# Patient Record
Sex: Male | Born: 1975 | Race: White | Hispanic: No | Marital: Single | Smoking: Current every day smoker
Health system: Southern US, Community
[De-identification: ages and names within clinical notes are randomized; demographics above are authoritative.]

## PROBLEM LIST (undated history)

## (undated) DIAGNOSIS — I639 Cerebral infarction, unspecified: Secondary | ICD-10-CM

---

## 1999-02-17 ENCOUNTER — Emergency Department (HOSPITAL_COMMUNITY): Admission: EM | Admit: 1999-02-17 | Discharge: 1999-02-17 | Payer: Self-pay | Admitting: Emergency Medicine

## 1999-02-17 ENCOUNTER — Encounter: Payer: Self-pay | Admitting: Emergency Medicine

## 2002-06-05 DIAGNOSIS — I639 Cerebral infarction, unspecified: Secondary | ICD-10-CM

## 2002-06-05 HISTORY — DX: Cerebral infarction, unspecified: I63.9

## 2012-08-26 ENCOUNTER — Emergency Department (HOSPITAL_COMMUNITY): Payer: BC Managed Care – PPO

## 2012-08-26 ENCOUNTER — Emergency Department (HOSPITAL_COMMUNITY)
Admission: EM | Admit: 2012-08-26 | Discharge: 2012-08-26 | Disposition: A | Discharge status: Home / Self Care | Payer: BC Managed Care – PPO | Attending: Emergency Medicine | Admitting: Emergency Medicine

## 2012-08-26 ENCOUNTER — Encounter (HOSPITAL_COMMUNITY): Payer: Self-pay | Admitting: Emergency Medicine

## 2012-08-26 DIAGNOSIS — F17200 Nicotine dependence, unspecified, uncomplicated: Secondary | ICD-10-CM | POA: Insufficient documentation

## 2012-08-26 DIAGNOSIS — R071 Chest pain on breathing: Secondary | ICD-10-CM | POA: Insufficient documentation

## 2012-08-26 DIAGNOSIS — R209 Unspecified disturbances of skin sensation: Secondary | ICD-10-CM | POA: Insufficient documentation

## 2012-08-26 DIAGNOSIS — Z79899 Other long term (current) drug therapy: Secondary | ICD-10-CM | POA: Insufficient documentation

## 2012-08-26 DIAGNOSIS — R0789 Other chest pain: Secondary | ICD-10-CM

## 2012-08-26 DIAGNOSIS — Z7982 Long term (current) use of aspirin: Secondary | ICD-10-CM | POA: Insufficient documentation

## 2012-08-26 HISTORY — DX: Cerebral infarction, unspecified: I63.9

## 2012-08-26 LAB — BASIC METABOLIC PANEL
BUN: 12 mg/dL (ref 6–23)
CO2: 26 mEq/L (ref 19–32)
Calcium: 9.8 mg/dL (ref 8.4–10.5)
Chloride: 102 mEq/L (ref 96–112)
Creatinine, Ser: 0.92 mg/dL (ref 0.50–1.35)
GFR calc Af Amer: >90 mL/min (ref >90)
GFR calc non Af Amer: >90 mL/min (ref >90)
Glucose, Bld: 92 mg/dL (ref 70–99)
Potassium: 4.6 mEq/L (ref 3.5–5.1)
Sodium: 138 mEq/L (ref 135–145)

## 2012-08-26 LAB — CBC WITH DIFFERENTIAL/PLATELET
Basophils Absolute: 0 10*3/uL (ref 0.0–0.1)
Basophils Relative: 1 % (ref 0–1)
Eosinophils Absolute: 0.2 10*3/uL (ref 0.0–0.7)
Eosinophils Relative: 3 % (ref 0–5)
HCT: 41.5 % (ref 39.0–52.0)
Hemoglobin: 15.2 g/dL (ref 13.0–17.0)
Lymphocytes Relative: 22 % (ref 12–46)
Lymphs Abs: 1.4 10*3/uL (ref 0.7–4.0)
MCH: 29.6 pg (ref 26.0–34.0)
MCHC: 36.6 g/dL — ABNORMAL HIGH (ref 30.0–36.0)
MCV: 80.7 fL (ref 78.0–100.0)
Monocytes Absolute: 0.5 10*3/uL (ref 0.1–1.0)
Monocytes Relative: 7 % (ref 3–12)
Neutro Abs: 4.3 10*3/uL (ref 1.7–7.7)
Neutrophils Relative %: 68 % (ref 43–77)
Platelets: 241 10*3/uL (ref 150–400)
RBC: 5.14 MIL/uL (ref 4.22–5.81)
RDW: 12.7 % (ref 11.5–15.5)
WBC: 6.4 10*3/uL (ref 4.0–10.5)

## 2012-08-26 LAB — POCT I-STAT TROPONIN I: Troponin i, poc: 0 ng/mL (ref 0.00–0.08)

## 2012-08-26 NOTE — ED Notes (Signed)
Onset today while at work sitting down developed sudden onset of tunnel vision, left sided chest pain 4/10 achy radiating to right upper extremity numbness.  Bilateral equal strong hand grasps. Ax4 answering and following commands appropriate.  History of a stroke 10 years ago no residual effects.

## 2012-08-26 NOTE — ED Provider Notes (Signed)
  Medical screening examination/treatment/procedure(s) were performed by non-physician practitioner and as supervising physician I was immediately available for consultation/collaboration.    Gerhard Munch, MD 08/26/12 [phone]

## 2012-08-26 NOTE — ED Provider Notes (Signed)
History     CSN: [id number]  Arrival date & time 08/26/12  1059   First MD Initiated Contact with Patient 08/26/12 1145      Chief Complaint  Patient presents with  . Chest Pain  . Numbness    (Consider location/radiation/quality/duration/timing/severity/associated sxs/prior treatment) Patient is a 37 y.o. male presenting with chest pain. The history is provided by the patient. No language interpreter was used.  Chest Pain Pain location:  L chest Pain quality: aching   Pain radiates to the back: no   Pain severity:  Moderate Timing:  Constant Progression:  Worsening Chronicity:  New Relieved by:  Nothing Worsened by:  Nothing tried Ineffective treatments:  None tried  Pt complains of left sided chest pain that radiated to arms.  Pt reports arms felt tingly.  Pt reports he had a stroke 10 years ago and worrys about other symptoms.   Past Medical History  Diagnosis Date  . Stroke 2004    left side deficits, completely resolved    History reviewed. No pertinent past surgical history.  History reviewed. No pertinent family history.  History  Substance Use Topics  . Smoking status: Current Every Day Smoker  . Smokeless tobacco: Not on file  . Alcohol Use: Yes      Review of Systems  Cardiovascular: Positive for chest pain.  All other systems reviewed and are negative.    Allergies  Review of patient's allergies indicates no known allergies.  Home Medications   Current Outpatient Rx  Name  Route  Sig  Dispense  Refill  . aspirin 325 MG tablet   Oral   Take 650 mg by mouth once as needed for pain.         Marland Kitchen aspirin EC 81 MG tablet   Oral   Take 81 mg by mouth daily.         . Multiple Vitamin (MULTIVITAMIN WITH MINERALS) TABS   Oral   Take 1 tablet by mouth daily.           BP 144/97  Pulse 67  Temp(Src) 97.8 F (36.6 C) (Oral)  Resp 18  Ht 6' (1.829 m)  Wt 160 lb (72.576 kg)  BMI 21.7 kg/m2  SpO2 96%  Physical Exam  Nursing note  and vitals reviewed. Constitutional: He is oriented to person, place, and time. He appears well-developed and well-nourished.  HENT:  Head: Normocephalic.  Right Ear: External ear normal.  Left Ear: External ear normal.  Nose: Nose normal.  Mouth/Throat: Oropharynx is clear and moist.  Eyes: Conjunctivae are normal. Pupils are equal, round, and reactive to light.  Neck: Normal range of motion.  Cardiovascular: Normal rate and normal heart sounds.   Pulmonary/Chest: Effort normal.  Abdominal: Soft.  Musculoskeletal: Normal range of motion.  Neurological: He is alert and oriented to person, place, and time. He has normal reflexes.  Skin: Skin is warm.  Psychiatric: He has a normal mood and affect.    ED Course  Procedures (including critical care time)  Labs Reviewed  CBC WITH DIFFERENTIAL - Abnormal; Notable for the following:    MCHC 36.6 (*)    All other components within normal limits  BASIC METABOLIC PANEL  POCT I-STAT TROPONIN I   Dg Chest 2 View  08/26/2012  *RADIOLOGY REPORT*  Clinical Data: Chest pain  CHEST - 2 VIEW  Comparison: 06/04/2012  Findings: The heart and pulmonary vascularity are within normal limits.  The lungs are mildly hyperinflated likely related to a  vigorous inspiratory effort.  No focal infiltrate or sizable effusion is seen.  No bony abnormality is noted.  IMPRESSION: No acute abnormality noted.   Original Report Authenticated By: Alcide Clever, M.D.      No diagnosis found.    MDM   Date: 08/26/2012  Rate: 72  Rhythm: normal sinus rhythm  QRS Axis: normal  Intervals: normal  ST/T Wave abnormalities: normal  Conduction Disutrbances:none  Narrative Interpretation:   Old EKG Reviewed: none available     Results for orders placed during the hospital encounter of 08/26/12  CBC WITH DIFFERENTIAL      Result Value Range   WBC 6.4  4.0 - 10.5 K/uL   RBC 5.14  4.22 - 5.81 MIL/uL   Hemoglobin 15.2  13.0 - 17.0 g/dL   HCT 16.1  09.6 - 04.5 %    MCV 80.7  78.0 - 100.0 fL   MCH 29.6  26.0 - 34.0 pg   MCHC 36.6 (*) 30.0 - 36.0 g/dL   RDW 40.9  81.1 - 91.4 %   Platelets 241  150 - 400 K/uL   Neutrophils Relative 68  43 - 77 %   Neutro Abs 4.3  1.7 - 7.7 K/uL   Lymphocytes Relative 22  12 - 46 %   Lymphs Abs 1.4  0.7 - 4.0 K/uL   Monocytes Relative 7  3 - 12 %   Monocytes Absolute 0.5  0.1 - 1.0 K/uL   Eosinophils Relative 3  0 - 5 %   Eosinophils Absolute 0.2  0.0 - 0.7 K/uL   Basophils Relative 1  0 - 1 %   Basophils Absolute 0.0  0.0 - 0.1 K/uL  BASIC METABOLIC PANEL      Result Value Range   Sodium 138  135 - 145 mEq/L   Potassium 4.6  3.5 - 5.1 mEq/L   Chloride 102  96 - 112 mEq/L   CO2 26  19 - 32 mEq/L   Glucose, Bld 92  70 - 99 mg/dL   BUN 12  6 - 23 mg/dL   Creatinine, Ser 7.82  0.50 - 1.35 mg/dL   Calcium 9.8  8.4 - 95.6 mg/dL   GFR calc non Af Amer >90  >90 mL/min   GFR calc Af Amer >90  >90 mL/min  POCT I-STAT TROPONIN I      Result Value Range   Troponin i, poc 0.00  0.00 - 0.08 ng/mL   Comment 3            Dg Chest 2 View  08/26/2012  *RADIOLOGY REPORT*  Clinical Data: Chest pain  CHEST - 2 VIEW  Comparison: 06/04/2012  Findings: The heart and pulmonary vascularity are within normal limits.  The lungs are mildly hyperinflated likely related to a vigorous inspiratory effort.  No focal infiltrate or sizable effusion is seen.  No bony abnormality is noted.  IMPRESSION: No acute abnormality noted.   Original Report Authenticated By: Alcide Clever, M.D.     Troponin negative,  Bmet.  Cbc  Normal.    Pt advised to follow up with his Md for recheck.   Tylenol for discomfort   Elson Areas, [place] 08/26/12 1341

## 2013-04-28 ENCOUNTER — Emergency Department (HOSPITAL_COMMUNITY): Payer: BC Managed Care – PPO

## 2013-04-28 ENCOUNTER — Emergency Department (HOSPITAL_COMMUNITY)
Admission: EM | Admit: 2013-04-28 | Discharge: 2013-04-28 | Disposition: A | Discharge status: Home / Self Care | Payer: BC Managed Care – PPO | Attending: Emergency Medicine | Admitting: Emergency Medicine

## 2013-04-28 DIAGNOSIS — J029 Acute pharyngitis, unspecified: Secondary | ICD-10-CM | POA: Insufficient documentation

## 2013-04-28 DIAGNOSIS — R682 Dry mouth, unspecified: Secondary | ICD-10-CM

## 2013-04-28 DIAGNOSIS — K117 Disturbances of salivary secretion: Secondary | ICD-10-CM | POA: Insufficient documentation

## 2013-04-28 DIAGNOSIS — F17200 Nicotine dependence, unspecified, uncomplicated: Secondary | ICD-10-CM | POA: Insufficient documentation

## 2013-04-28 DIAGNOSIS — Z792 Long term (current) use of antibiotics: Secondary | ICD-10-CM | POA: Insufficient documentation

## 2013-04-28 DIAGNOSIS — M542 Cervicalgia: Secondary | ICD-10-CM | POA: Insufficient documentation

## 2013-04-28 DIAGNOSIS — Z7982 Long term (current) use of aspirin: Secondary | ICD-10-CM | POA: Insufficient documentation

## 2013-04-28 DIAGNOSIS — Z9889 Other specified postprocedural states: Secondary | ICD-10-CM | POA: Insufficient documentation

## 2013-04-28 DIAGNOSIS — Z8673 Personal history of transient ischemic attack (TIA), and cerebral infarction without residual deficits: Secondary | ICD-10-CM | POA: Insufficient documentation

## 2013-04-28 LAB — COMPREHENSIVE METABOLIC PANEL
Alkaline Phosphatase: 70 U/L (ref 39–117)
BUN: 13 mg/dL (ref 6–23)
CO2: 28 mEq/L (ref 19–32)
Chloride: 102 mEq/L (ref 96–112)
GFR calc Af Amer: >90 mL/min (ref >90)
Glucose, Bld: 85 mg/dL (ref 70–99)
Potassium: 4.7 mEq/L (ref 3.5–5.1)
Total Bilirubin: 0.7 mg/dL (ref 0.3–1.2)

## 2013-04-28 LAB — CBC WITH DIFFERENTIAL/PLATELET
Hemoglobin: 14 g/dL (ref 13.0–17.0)
Lymphs Abs: 1.3 10*3/uL (ref 0.7–4.0)
MCH: 29.7 pg (ref 26.0–34.0)
Monocytes Relative: 10 % (ref 3–12)
Neutro Abs: 2.8 10*3/uL (ref 1.7–7.7)
Neutrophils Relative %: 57 % (ref 43–77)
RBC: 4.72 MIL/uL (ref 4.22–5.81)

## 2013-04-28 MED ORDER — IOHEXOL 300 MG/ML  SOLN
75.0000 mL | Freq: Once | INTRAMUSCULAR | Status: AC | PRN
  Administered 2013-04-28: 75 mL via INTRAVENOUS

## 2013-04-28 NOTE — ED Notes (Signed)
MD at bedside. 

## 2013-04-28 NOTE — ED Provider Notes (Signed)
Patient initially seen and evaluated by Ms. Trellis Moment. He presents with foreign body sensation in his throat for the last month. There is some associated dry mouth. Symptoms are mainly on the left. CT showed no evidence of foreign body. he has no other symptoms suggestive of lupus. He is referred back to his PCP for further outpatient evaluation. I've discussed with him about the possibility of Sjogren's syndrome.  Medical screening examination/treatment/procedure(s) were conducted as a shared visit with non-physician practitioner(s) and myself.  I personally evaluated the patient during the encounter.   Dione Booze, MD 04/28/13 [phone]

## 2013-04-28 NOTE — ED Notes (Signed)
Pt states had  Dental surgery  X 1 month ago  Felt like his throat has been swelling x 1 week went to dr and placed on antiobiotics , but still feels like it has been swelling and veins under tongue has alsonfelt like they have been swelling pt is handling all oral secreations well  Has been able to eat and drink but todayfelt different

## 2013-04-28 NOTE — ED Provider Notes (Signed)
CSN: [id number]     Arrival date & time 04/28/13  1055 History   First MD Initiated Contact with Patient 04/28/13 1405     Chief Complaint  Patient presents with  . Oral Swelling   (Consider location/radiation/quality/duration/timing/severity/associated sxs/prior Treatment) HPI David Arellano is a 37 y.o. male who presents to emergency department complaining of left-sided neck pain. Patient states that he had a dental procedure done less than a month ago where they extracted tooth and inserted a prostatic bone graft. Patient states that he was seen approximately 2 weeks ago for pain in that side and was given antibiotics for possible infection. Patient is paced he is been taking amoxicillin "on and off" for 2 weeks. States his pain is not improving. Patient states that his pain is worse with swallowing and he feels like his throat is closing up. States that it is tender to palpation as well. States this morning he felt like he couldn't swallow. Patient denies any fever, chills. He denies any neck swelling. He denies any injuries.  Past Medical History  Diagnosis Date  . Stroke 2004    left side deficits, completely resolved   No past surgical history on file. No family history on file. History  Substance Use Topics  . Smoking status: Current Every Day Smoker  . Smokeless tobacco: Not on file  . Alcohol Use: Yes    Review of Systems  Constitutional: Negative for fever and chills.  HENT: Positive for sore throat. Negative for dental problem.   Respiratory: Negative for cough, chest tightness and shortness of breath.   Cardiovascular: Negative for chest pain, palpitations and leg swelling.  Gastrointestinal: Negative for nausea, vomiting, abdominal pain, diarrhea and abdominal distention.  Genitourinary: Negative for dysuria.  Musculoskeletal: Positive for neck pain. Negative for arthralgias, myalgias and neck stiffness.  Skin: Negative for rash.  Allergic/Immunologic:  Negative for immunocompromised state.  Neurological: Negative for dizziness, weakness, light-headedness, numbness and headaches.    Allergies  Review of patient's allergies indicates no known allergies.  Home Medications   Current Outpatient Rx  Name  Route  Sig  Dispense  Refill  . amoxicillin (AMOXIL) 500 MG capsule   Oral   Take 500 mg by mouth 3 (three) times daily. Take for 10 days. First dose 04/09/13         . aspirin 325 MG tablet   Oral   Take 325 mg by mouth daily.          . Multiple Vitamin (MULTIVITAMIN WITH MINERALS) TABS   Oral   Take 1 tablet by mouth daily.          BP 145/92  Pulse 58  Temp(Src) 98.3 F (36.8 C)  Resp 16  Wt 140 lb (63.504 kg)  SpO2 98% Physical Exam  Nursing note and vitals reviewed. Constitutional: He appears well-developed and well-nourished. No distress.  HENT:  Head: Normocephalic and atraumatic.  Right Ear: External ear normal.  Left Ear: External ear normal.  Nose: Nose normal.  Mouth/Throat: Oropharynx is clear and moist.  No pharynx edema. Uvula midline.  Eyes: Conjunctivae are normal.  Neck: Normal range of motion. Neck supple.  Tender to palpation over left submandibular area and left anterior cervical area. No lymphadenopathy noted. No masses palpated.  Cardiovascular: Normal rate, regular rhythm and normal heart sounds.   Pulmonary/Chest: Effort normal. No respiratory distress. He has no wheezes. He has no rales.  Abdominal: Soft. Bowel sounds are normal. He exhibits no distension. There is no  tenderness. There is no rebound.  Musculoskeletal: He exhibits no edema.  Neurological: He is alert.  Skin: Skin is warm and dry.    ED Course  Procedures (including critical care time) Labs Review Labs Reviewed  CBC WITH DIFFERENTIAL - Abnormal; Notable for the following:    Eosinophils Relative 7 (*)    All other components within normal limits  COMPREHENSIVE METABOLIC PANEL   Imaging Review No results  found.  EKG Interpretation   None       MDM  No diagnosis found.  PT with sore throat, feels like there is a mass or something swelling. Requesting CT. Will get CT neck soft tissue.     Lottie Mussel, PA-C 04/28/13 1701

## 2013-04-28 NOTE — ED Notes (Signed)
Patient transported to CT 

## 2013-04-28 NOTE — ED Notes (Signed)
Pt states that he has been able to swallow but states that today has had some increase in difficulty. States "i have to think about it"

## 2013-04-29 NOTE — ED Provider Notes (Signed)
  Medical screening examination/treatment/procedure(s) were performed by non-physician practitioner and as supervising physician I was immediately available for consultation/collaboration.  EKG Interpretation   None         Gerhard Munch, MD 04/29/13 1131

## 2014-12-10 ENCOUNTER — Emergency Department (HOSPITAL_COMMUNITY)
Admission: EM | Admit: 2014-12-10 | Discharge: 2014-12-10 | Disposition: A | Discharge status: Home / Self Care | Payer: Managed Care, Other (non HMO) | Attending: Emergency Medicine | Admitting: Emergency Medicine

## 2014-12-10 ENCOUNTER — Encounter (HOSPITAL_COMMUNITY): Payer: Self-pay | Admitting: Emergency Medicine

## 2014-12-10 ENCOUNTER — Emergency Department (HOSPITAL_COMMUNITY): Payer: Managed Care, Other (non HMO)

## 2014-12-10 DIAGNOSIS — Y9289 Other specified places as the place of occurrence of the external cause: Secondary | ICD-10-CM | POA: Diagnosis not present

## 2014-12-10 DIAGNOSIS — Z8673 Personal history of transient ischemic attack (TIA), and cerebral infarction without residual deficits: Secondary | ICD-10-CM | POA: Insufficient documentation

## 2014-12-10 DIAGNOSIS — W228XXA Striking against or struck by other objects, initial encounter: Secondary | ICD-10-CM | POA: Insufficient documentation

## 2014-12-10 DIAGNOSIS — Y998 Other external cause status: Secondary | ICD-10-CM | POA: Insufficient documentation

## 2014-12-10 DIAGNOSIS — Z720 Tobacco use: Secondary | ICD-10-CM | POA: Insufficient documentation

## 2014-12-10 DIAGNOSIS — Y9389 Activity, other specified: Secondary | ICD-10-CM | POA: Insufficient documentation

## 2014-12-10 DIAGNOSIS — Z7982 Long term (current) use of aspirin: Secondary | ICD-10-CM | POA: Insufficient documentation

## 2014-12-10 DIAGNOSIS — S62336A Displaced fracture of neck of fifth metacarpal bone, right hand, initial encounter for closed fracture: Secondary | ICD-10-CM | POA: Insufficient documentation

## 2014-12-10 DIAGNOSIS — Z79899 Other long term (current) drug therapy: Secondary | ICD-10-CM | POA: Insufficient documentation

## 2014-12-10 DIAGNOSIS — S6991XA Unspecified injury of right wrist, hand and finger(s), initial encounter: Secondary | ICD-10-CM | POA: Diagnosis present

## 2014-12-10 DIAGNOSIS — S62339A Displaced fracture of neck of unspecified metacarpal bone, initial encounter for closed fracture: Secondary | ICD-10-CM

## 2014-12-10 NOTE — ED Notes (Signed)
Patient states he hit a door yesterday morning with R hand.  Patient complains of R hand pain and swelling since.

## 2014-12-10 NOTE — Discharge Instructions (Signed)
Boxer's Fracture °Boxer's fracture is a broken bone (fracture) of the fourth or fifth metacarpal (ring or pinky finger). The metacarpal bones connect the wrist to the fingers and make up the arch of the hand. Boxer's fracture occurs toward the body (proximal) from the first knuckle. This injury is known as a boxer's fracture, because it often occurs from hitting an object with a closed fist. °SYMPTOMS  °· Severe pain at the time of injury. °· Pain and swelling around the first knuckle on the fourth or fifth finger. °· Bruising (contusion) in the area within 48 hours of injury. °· Visible deformity, such as a pushed down knuckle. This can occur if the fracture is complete, and the bone fragments separate enough to distort normal body shape. °· Numbness or paralysis from swelling in the hand, causing pressure on the blood vessels or nerves (uncommon). °CAUSES  °· Direct injury (trauma), such as a striking blow with the fist. °· Indirect stress to the hand, such as twisting or violent muscle contraction (uncommon). °RISK INCREASES WITH: °· Punching an object with an unprotected knuckle. °· Contact sports (football, rugby). °· Sports that require hitting (boxing, martial arts). °· History of bone or joint disease (osteoporosis). °PREVENTION °· Maintain physical fitness: °¨ Muscle strength and flexibility. °¨ Endurance. °¨ Cardiovascular fitness. °· For participation in contact sports, wear proper protective equipment for the hand and make sure it fits properly. °· Learn and use proper technique when hitting or punching. °PROGNOSIS  °When proper treatment is given, to ensure normal alignment of the bones, healing can usually be expected in 4 to 6 weeks. Occasionally, surgery is necessary.  °RELATED COMPLICATIONS  °· Bone does not heal back together (nonunion). °· Bone heals together in an improper position (malunion), causing twisting of the finger when making a fist. °· Chronic pain, stiffness, or swelling of the  hand. °· Excessive bleeding in the hand, causing pressure and injury to nerves and blood vessels (rare). °· Stopping of normal hand growth in children. °· Infection of the wound, if skin is broken over the fracture (open fracture), or at the incision site if surgery is performed. °· Shortening of injured bones. °· Bony bump (prominence) in the palm or loss of shape of the knuckles. °· Pain and weakness when gripping. °· Arthritis of the affected joint, if the fracture goes into the joint, after repeated injury, or after delayed treatment. °· Scarring around the knuckle, and limited motion. °TREATMENT  °Treatment varies, depending on the injury. The place in the hand where the injury occurs has a great deal of motion, which allows the hand to move properly. If the fracture is not aligned properly, this function may be decreased. If the bone ends are in proper alignment, treatment first involves ice and elevation of the injured hand, at or above heart level, to reduce inflammation. Pain medicines help to relieve pain. Treatment also involves restraint by splinting, bandaging, casting, or bracing for 4 or more weeks.  °If the fracture is out of alignment (displaced), or it involves the joint, surgery is usually recommended. Surgery typically involves cutting through the skin to place removable pins, screws, and sometimes plates over the fracture. After surgery, the bone and joint are restrained for 4 or more weeks. After restraint (with or without surgery), stretching and strengthening exercises are needed to regain proper strength and function of the joint. Exercises may be done at home or with the assistance of a therapist. Depending on the sport and position played, a   brace or splint may be recommended when first returning to sports.  °MEDICATION  °· If pain medication is necessary, nonsteroidal anti-inflammatory medications, such as aspirin and ibuprofen, or other minor pain relievers, such as acetaminophen, are  often recommended. °· Do not take pain medication for 7 days before surgery. °· Prescription pain relievers may be necessary. Use only as directed and only as much as you need. °COLD THERAPY °Cold treatment (icing) relieves pain and reduces inflammation. Cold treatment should be applied for 10 to 15 minutes every 2 to 3 hours for inflammation and pain, and immediately after any activity that aggravates your symptoms. Use ice packs or an ice massage. °SEEK MEDICAL CARE IF:  °· Pain, tenderness, or swelling gets worse, despite treatment. °· You experience pain, numbness, or coldness in the hand. °· Blue, gray, or dark color appears in the fingernails. °· You develop signs of infection, after surgery (fever, increased pain, swelling, redness, drainage of fluids, or bleeding in the surgical area). °· You feel you have reinjured the hand. °· New, unexplained symptoms develop. (Drugs used in treatment may produce side effects.) °Document Released: 05/22/2005 Document Revised: 08/14/2011 Document Reviewed: 09/03/2008 °Exit[employer]® Patient Information ©2015 [employer]. This information is not intended to replace advice given to you by your health care provider. Make sure you discuss any questions you have with your health care provider. ° °

## 2014-12-10 NOTE — ED Notes (Signed)
Paged Ortho  

## 2014-12-10 NOTE — Progress Notes (Signed)
Orthopedic Tech Progress Note Patient Details:  David Arellano David Arellano 08/03/1975 [id number] Applied fiberglass ulnar gutter splint to RUE.  Pulses, sensation, motion intact before and after splinting.  Capillary refill less than 2 seconds before and after splinting. Ortho Devices Type of Ortho Device: Ulna gutter splint Ortho Device/Splint Location: RUE Ortho Device/Splint Interventions: Application   Lesle ChrisGilliland, Jesper Stirewalt L 12/10/2014, 1:56 PM

## 2014-12-10 NOTE — ED Notes (Signed)
Ortho to come down and place an ulnar gutter to the right hand.

## 2014-12-10 NOTE — ED Notes (Signed)
Pt transported to Xray. 

## 2014-12-10 NOTE — ED Provider Notes (Signed)
CSN: [id number]     Arrival date & time 12/10/14  1140 History  This chart was scribed for non-physician practitioner Burna FortsJeff Nyjae Hodge, PA, working with Raeford RazorStephen Kohut, MD, by Tanda RockersMargaux Venter, ED Scribe. This patient was seen in room TR05C/TR05C and the patient's care was started at 1:27 PM.  Chief Complaint  Patient presents with  . Hand Pain   The history is provided by the patient. No language interpreter was used.     HPI Comments: David Arellano is a 39 y.o. male who presents to the Emergency Department complaining of sudden onset, right hand pain x 1 day. Pt states that he hit a door yesterday, causing the pain. He notes increased swelling to the hand. Pt has been taking Motrin with relief. Denies any other associated symptoms. No pain to rest shoulder elbow, patient reports intact grip strength, profusion of the extremity, no loss of sensation or function.  Past Medical History  Diagnosis Date  . Stroke 2004    left side deficits, completely resolved   History reviewed. No pertinent past surgical history. No family history on file. History  Substance Use Topics  . Smoking status: Current Every Day Smoker  . Smokeless tobacco: Not on file  . Alcohol Use: Yes    Review of Systems  All other systems reviewed and are negative.   Allergies  Review of patient's allergies indicates no known allergies.  Home Medications   Prior to Admission medications   Medication Sig Start Date End Date Taking? Authorizing Provider  amoxicillin (AMOXIL) 500 MG capsule Take 500 mg by mouth 3 (three) times daily. Take for 10 days. First dose 04/09/13    Historical Provider, MD  aspirin 325 MG tablet Take 325 mg by mouth daily.     Historical Provider, MD  Multiple Vitamin (MULTIVITAMIN WITH MINERALS) TABS Take 1 tablet by mouth daily.    Historical Provider, MD   Triage Vitals: BP 146/88 mmHg  Pulse 79  Temp(Src) 98 F (36.7 C) (Oral)  Resp 18  Ht 6' (1.829 m)  Wt 165 lb (74.844 kg)   BMI 22.37 kg/m2  SpO2 99%   Physical Exam  Constitutional: He is oriented to person, place, and time. He appears well-developed and well-nourished. No distress.  HENT:  Head: Normocephalic and atraumatic.  Eyes: Conjunctivae and EOM are normal.  Neck: Neck supple. No tracheal deviation present.  Cardiovascular: Normal rate, regular rhythm, normal heart sounds and intact distal pulses.  Exam reveals no gallop and no friction rub.   No murmur heard. Pulmonary/Chest: Effort normal and breath sounds normal. No respiratory distress. He has no wheezes. He has no rales. He exhibits no tenderness.  Musculoskeletal: Normal range of motion.  Swelling at the MCP of right hand of the 5th digit  Sensation perfusion intact in the distal aspect of the fingers Full Active ROM of the hand and fingers  Normal alignment of the fingers with clenched fist  No significant rotation  No pain to remainder of hand  Neurological: He is alert and oriented to person, place, and time. He has normal strength. No cranial nerve deficit or sensory deficit. Coordination and gait normal. GCS eye subscore is 4. GCS verbal subscore is 5. GCS motor subscore is 6.  Skin: Skin is warm and dry.  Psychiatric: He has a normal mood and affect. His behavior is normal.  Nursing note and vitals reviewed.   ED Course  Procedures (including critical care time)  DIAGNOSTIC STUDIES: Oxygen Saturation is 99% on  RA, normal by my interpretation.    COORDINATION OF CARE: 1:29 PM-Discussed treatment plan which includes hand splint with pt at bedside and pt agreed to plan.   Labs Review Labs Reviewed - No data to display  Imaging Review Dg Hand Complete Right  12/10/2014   CLINICAL DATA:  Hit door yesterday  EXAM: RIGHT HAND - COMPLETE 3+ VIEW  COMPARISON:  None.  FINDINGS: Fracture distal fifth metacarpal with mild displacement and mild angulation. Otherwise normal  IMPRESSION: Fracture distal fifth metacarpal   Electronically Signed    By: Marlan Palau M.D.   On: 12/10/2014 13:15     EKG Interpretation None      MDM   Final diagnoses:  Fx metacarpal neck-closed, initial encounter    Labs: None  Imaging: DG R Hand - I reviewed the x-rays and agreed with the radiologist reading fracture of the distal fifth metatarsal  Consults: None  Therapeutics: None  Assessment:  Plan:  Patient presents with a fracture of the distal fifth metacarpal. He has full strength of the affected extremity, sensation, perfusion intact. Fingertips properly aligned with closed fist. Patient placed in a ulnar gutter splint, and instructed follow-up with orthopedist. Strict return precautions given, patient reported he did not need any pain medication for home. She verbalizes understanding and agreement for today's plan.      I personally performed the services described in this documentation, which was scribed in my presence. The recorded information has been reviewed and is accurate.     Eyvonne Mechanic, PA-C 12/10/14 1624  Raeford Razor, MD 12/14/14 [phone]

## 2015-02-15 IMAGING — CT CT NECK W/ CM
4 series · 15 of 33 positions shown, 18 images · IV contrast (APPLIED)
Comparison: None.

CLINICAL DATA: Oral swelling

EXAM:
CT NECK WITH CONTRAST
TECHNIQUE: Multidetector CT imaging of the neck was performed using the
standard protocol following the bolus administration of intravenous
contrast.
CONTRAST:  75mL OMNIPAQUE IOHEXOL 300 MG/ML  SOLN

[Series 2: neck 2.0 i31s 3 · axial · 0.39mm/px · z∈[-250,-80]mm · 5 of 129 slices shown, 7 images]
[im 22/129  soft-tissue]
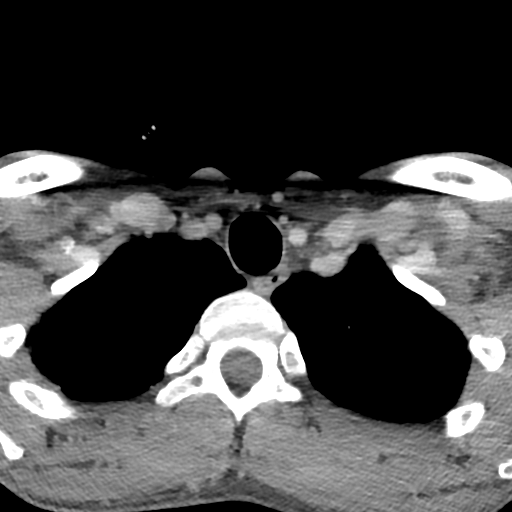
[im 22/129  bone]
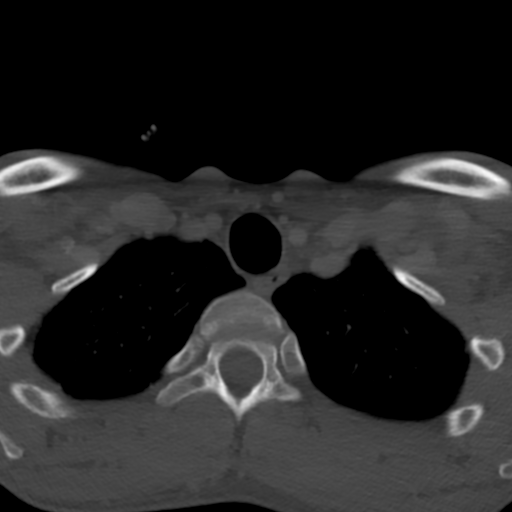
[im 43/129  bone]
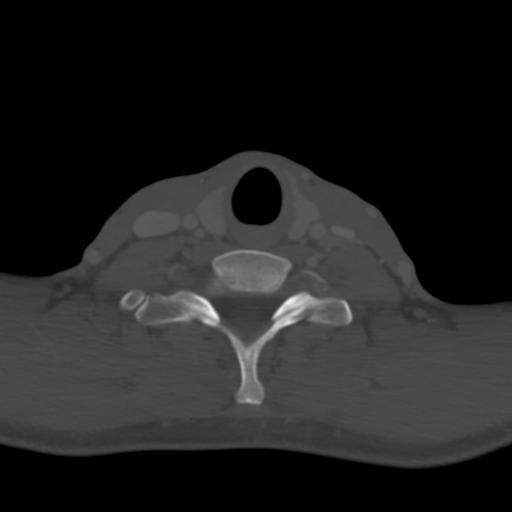
[im 65/129  bone]
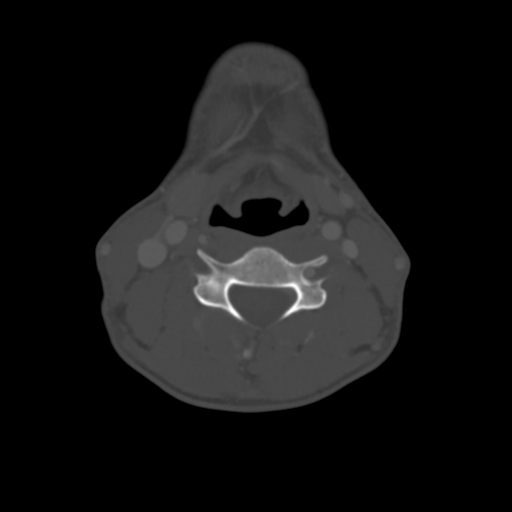
[im 86/129  bone]
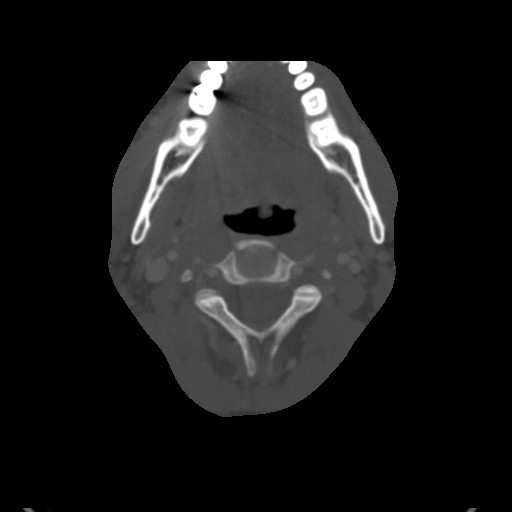
[im 107/129  soft-tissue]
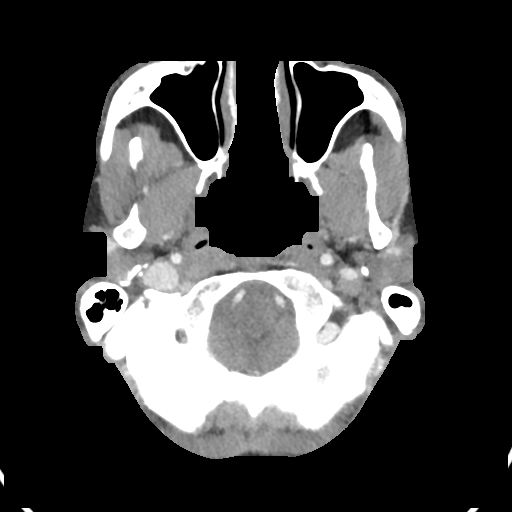
[im 107/129  bone]
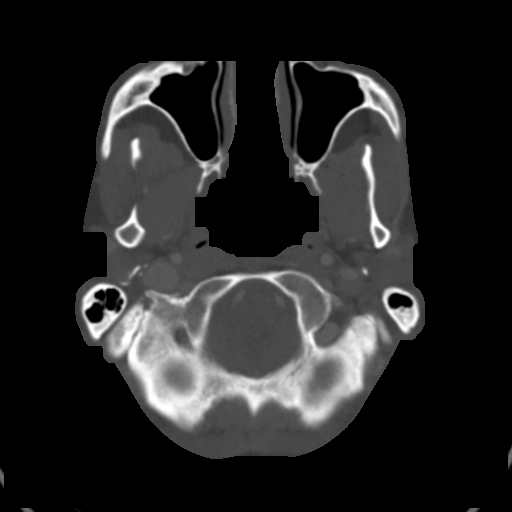

[Series 5: coronal st · coronal · 0.44mm/px · 3 of 87 slices shown]
[im 18/87  bone]
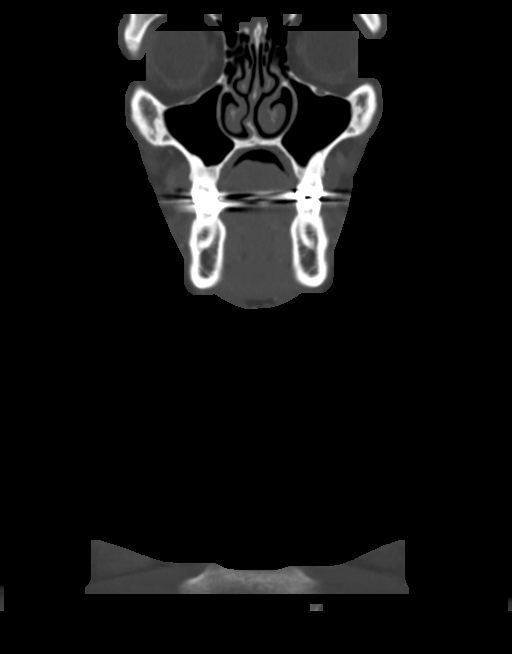
[im 35/87  bone]
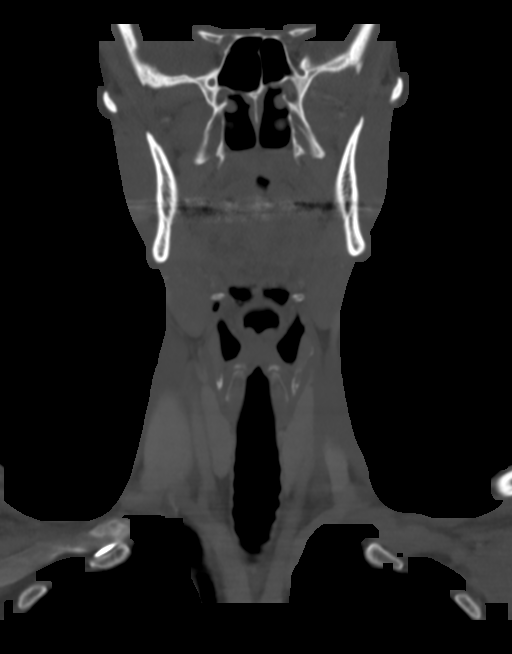
[im 52/87  bone]
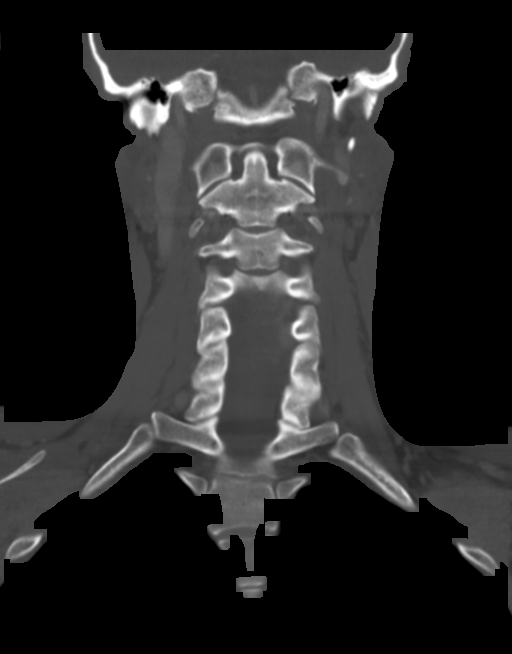

[Series 6: sagittal st · sagittal · 0.49mm/px · 5 of 101 slices shown, 6 images]
[im 34/101  bone]
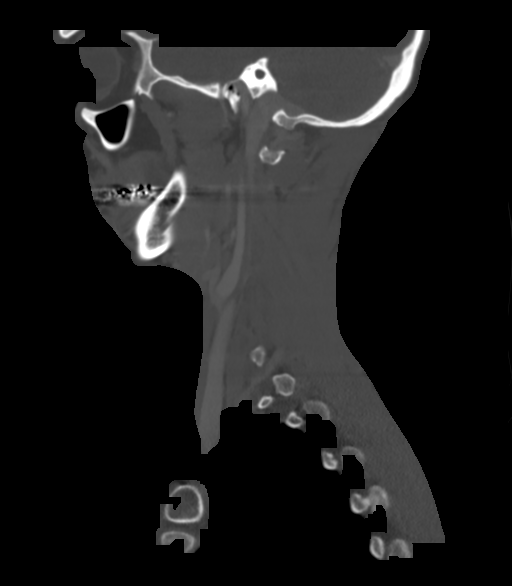
[im 42/101  bone]
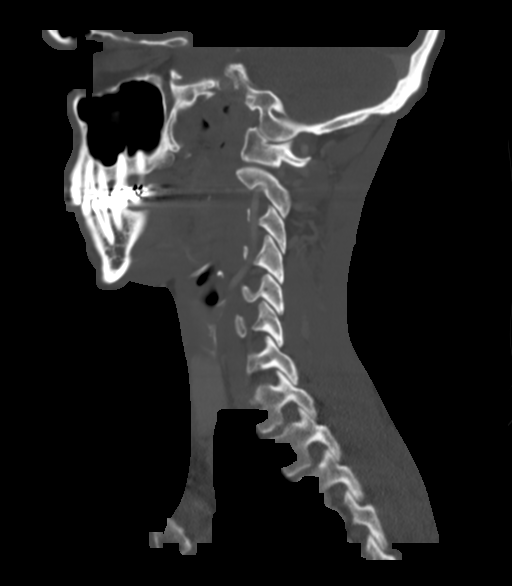
[im 51/101  soft-tissue]
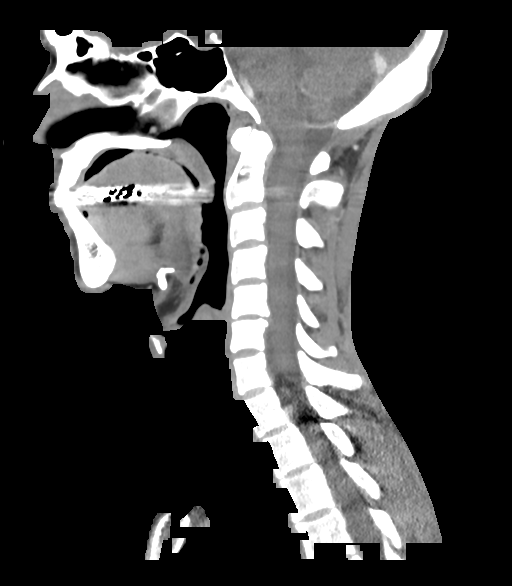
[im 51/101  bone]
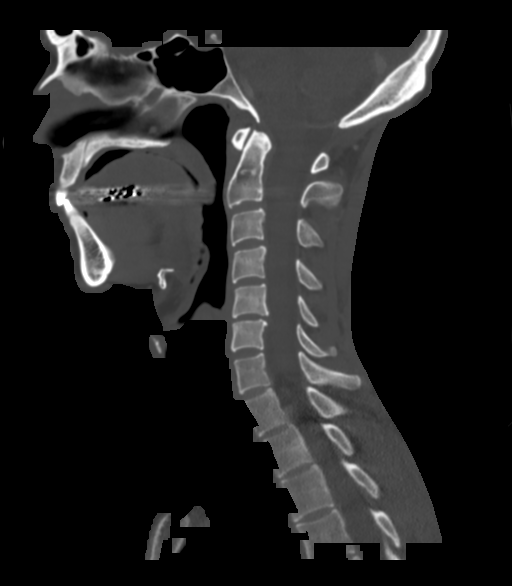
[im 59/101  bone]
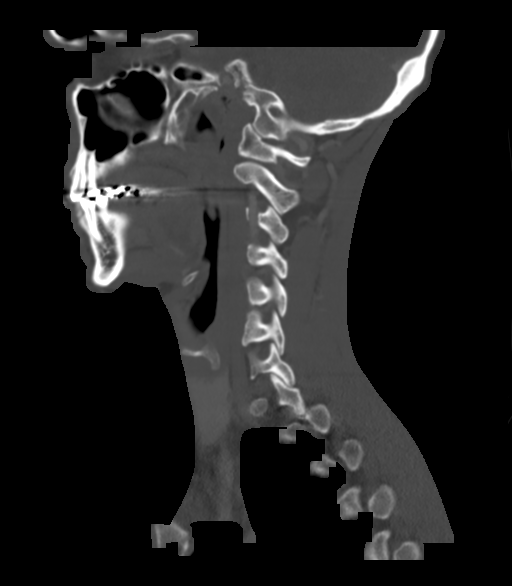
[im 67/101  bone]
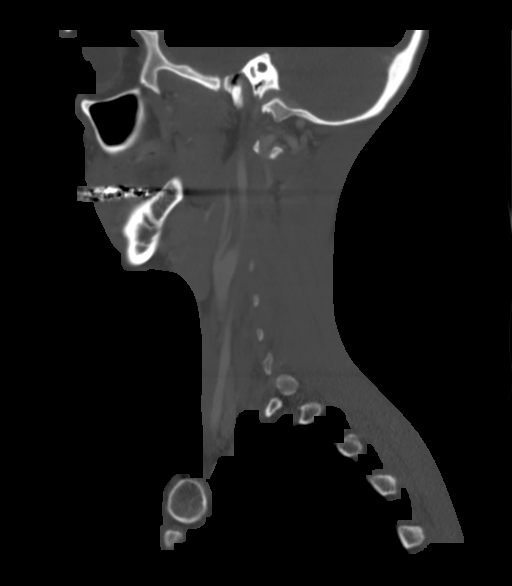

[Series 7: orthogonal st · axial · 0.39mm/px · z∈[-270,-227]mm · 2 of 131 slices shown]
[im 22/131  bone]
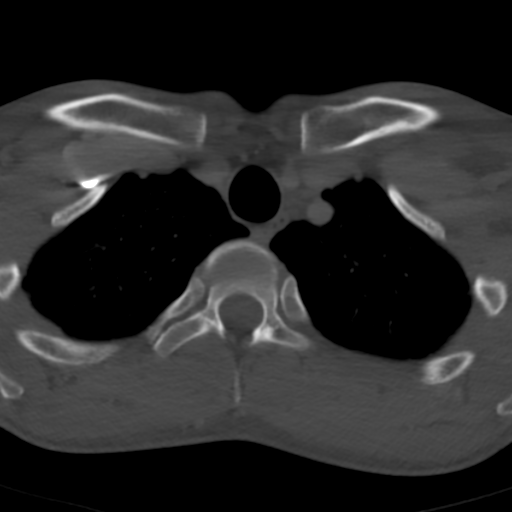
[im 44/131  bone]
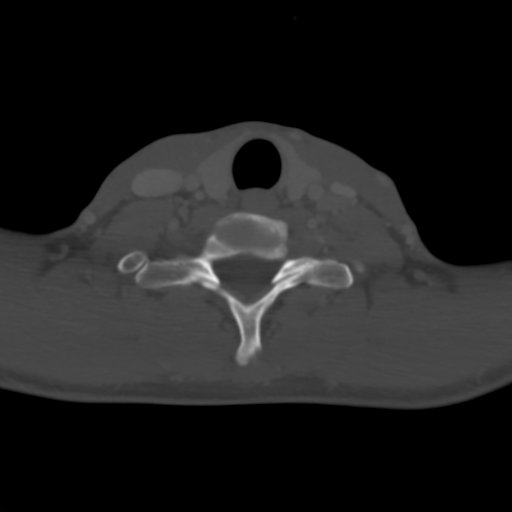

[15 of 33 positions shown; findings below may reference images not displayed]

FINDINGS: The airway is within normal limits and widely patent. The visualized
lung fields demonstrate mild emphysematous changes. No significant
lymphadenopathy is identified within the neck. The carotid artery
and jugular vein bilaterally are within normal limits. Orbits and
their contents are within normal limits. The paranasal sinuses are
well aerated without focal abnormality. No significant soft tissue
changes are seen. No prevertebral soft tissue swelling is noted. The
parotid and submandibular glands appear within normal limits. There
are changes consistent with a prior molar extraction on the left in
the maxilla. These changes are of uncertain age. Correlation with
the clinical exam and history is recommended. No significant
surrounding soft tissue changes seen.
IMPRESSION: Changes consistent with a prior molar extraction in the maxilla
posteriorly on the left. Clinical correlation with patient's dental
history is recommended. No other focal abnormality is noted.

## 2016-09-28 IMAGING — DX DG HAND COMPLETE 3+V*R*
3 series · 3 of 3 positions shown · non-contrast
Comparison: None.

CLINICAL DATA: Hit door yesterday

EXAM:
RIGHT HAND - COMPLETE 3+ VIEW

[hand pa]
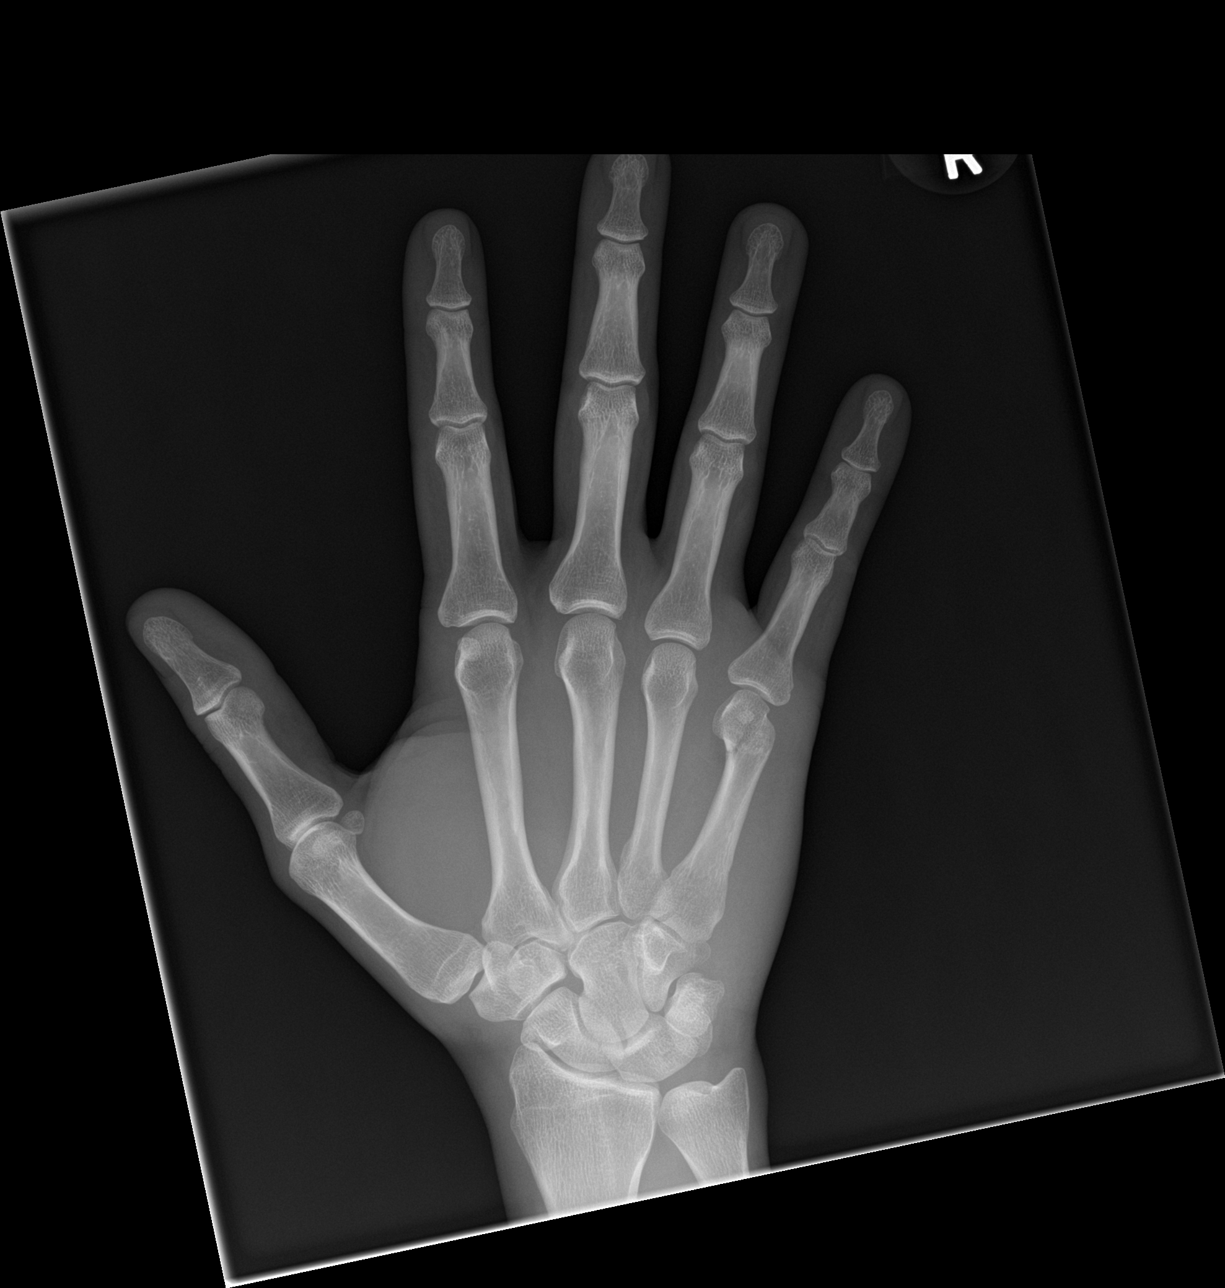

[hand obl]
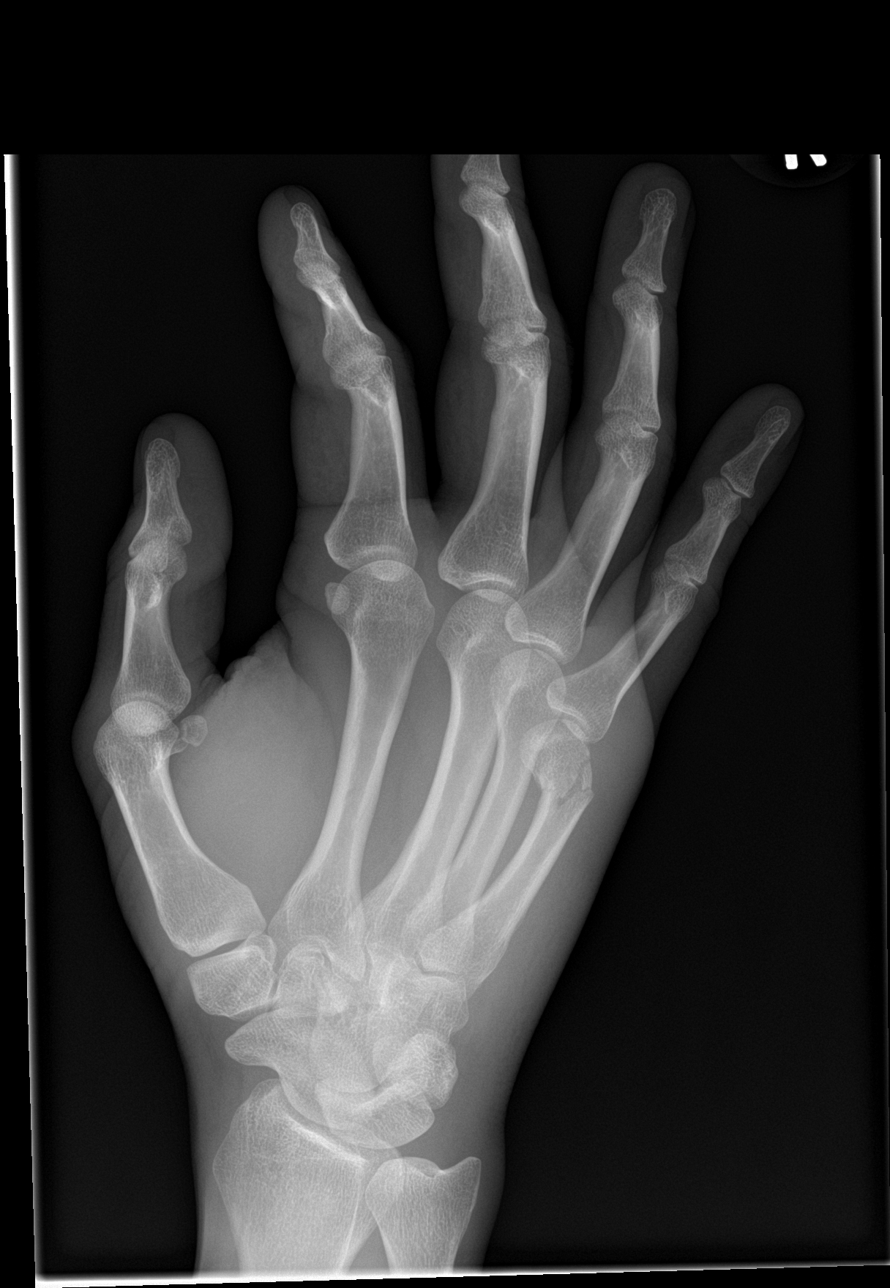

[hand lat]
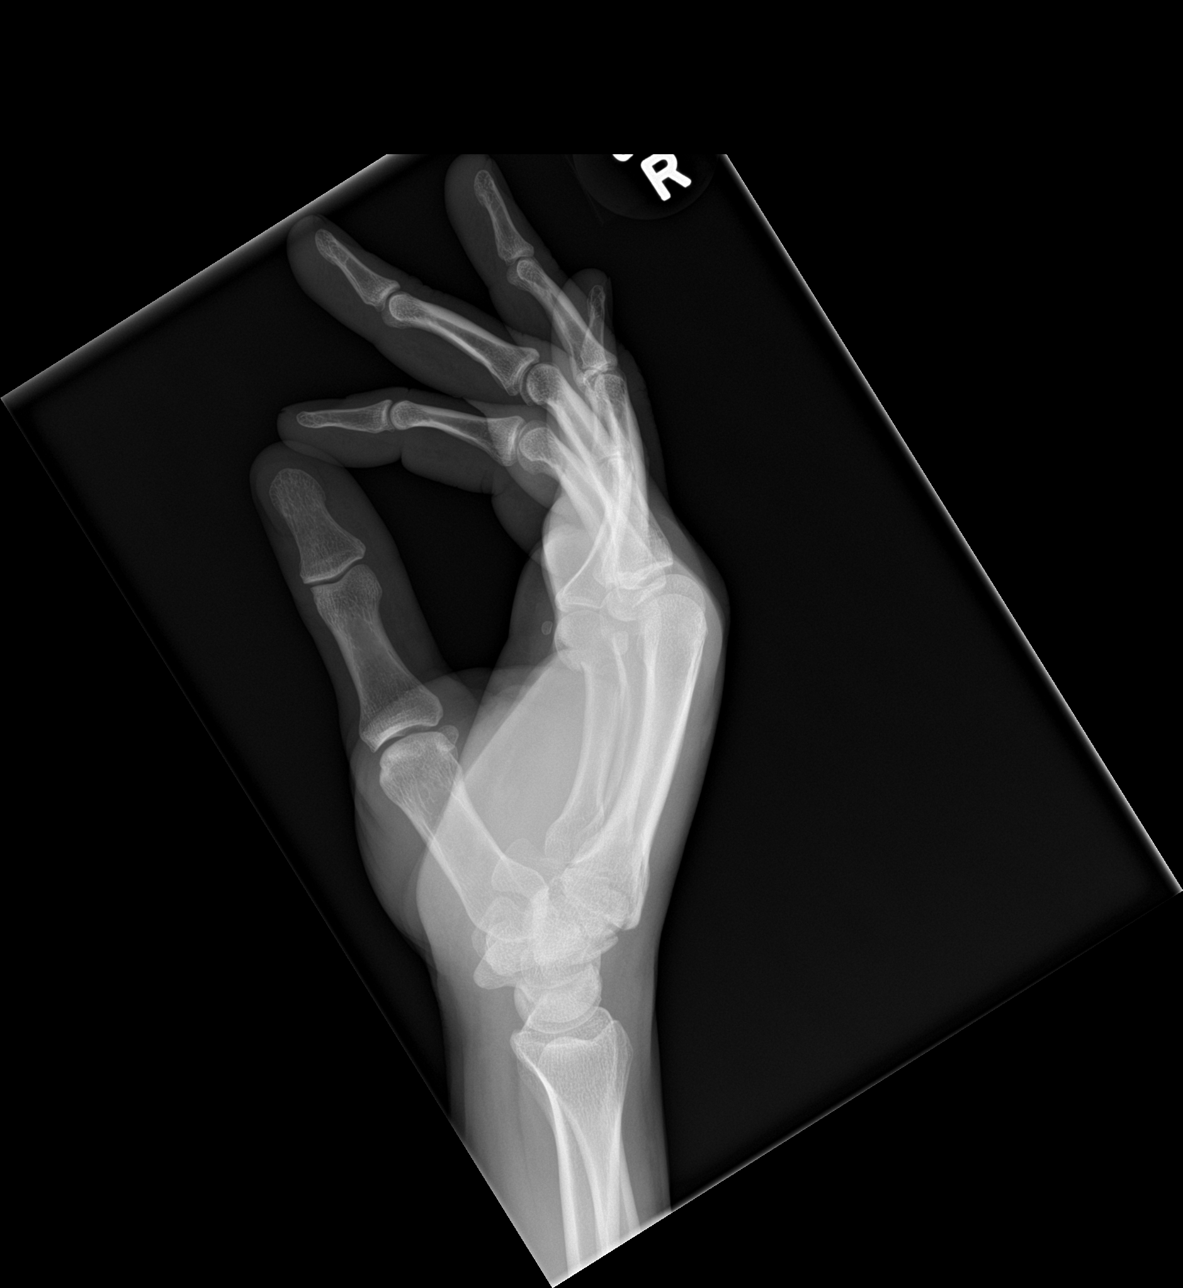

[3 of 3 positions shown; findings below may reference images not displayed]

FINDINGS: Fracture distal fifth metacarpal with mild displacement and mild
angulation. Otherwise normal
IMPRESSION: Fracture distal fifth metacarpal

## 2018-08-07 ENCOUNTER — Other Ambulatory Visit: Payer: Self-pay

## 2018-08-07 ENCOUNTER — Encounter (HOSPITAL_COMMUNITY): Payer: Self-pay | Admitting: Emergency Medicine

## 2018-08-07 ENCOUNTER — Emergency Department (HOSPITAL_COMMUNITY)
Admission: EM | Admit: 2018-08-07 | Discharge: 2018-08-07 | Disposition: A | Discharge status: Home / Self Care | Payer: BLUE CROSS/BLUE SHIELD | Attending: Emergency Medicine | Admitting: Emergency Medicine

## 2018-08-07 DIAGNOSIS — Z8673 Personal history of transient ischemic attack (TIA), and cerebral infarction without residual deficits: Secondary | ICD-10-CM | POA: Diagnosis not present

## 2018-08-07 DIAGNOSIS — T7840XA Allergy, unspecified, initial encounter: Secondary | ICD-10-CM | POA: Diagnosis not present

## 2018-08-07 DIAGNOSIS — R21 Rash and other nonspecific skin eruption: Secondary | ICD-10-CM | POA: Diagnosis present

## 2018-08-07 DIAGNOSIS — F17210 Nicotine dependence, cigarettes, uncomplicated: Secondary | ICD-10-CM | POA: Diagnosis not present

## 2018-08-07 MED ORDER — EPINEPHRINE 0.3 MG/0.3ML IJ SOAJ
0.3000 mg | INTRAMUSCULAR | Status: DC | PRN
  Administered 2018-08-07: 0.3 mg via INTRAMUSCULAR
  Filled 2018-08-07: qty 0.3

## 2018-08-07 NOTE — ED Triage Notes (Signed)
Pt arrives via [facility] from [facility] urgent care. Pt given 125mg  solumedrol IV, 20mg  pepcid IV, and 50mg  benadryl PO. Pt states the itching and burning on his torso have resolved at this time. Pt denies oral swelling.

## 2018-08-07 NOTE — ED Provider Notes (Signed)
[facility] Emergency Department Provider Note MRN:  [id number]  Arrival date & time: 08/07/18     Chief Complaint   Allergic Reaction   History of Present Illness   David Arellano is a 43 y.o. year-old male with history of stroke presenting to the ED with chief complaint of allergic reaction.  Patient explains that 2 hours prior to arrival, he woke up from a nap and realized that he had a diffuse raised red rash that him seem like "1 million mosquito bites".  He then noticed shortness of breath, sensation of throat closing.  Presented to urgent care, where he was given Solu-Medrol, Benadryl, brought here.  Patient explains that he had a similar but not as severe event about a month ago, woke up with just a rash, improved with Benadryl.  Last time he tried to switch his detergent.  No new medications, no new detergents recently, no known allergies.  Symptoms are resolved, currently with no complaints.  Review of Systems  A complete 10 system review of systems was obtained and all systems are negative except as noted in the HPI and PMH.   Patient's Health History    Past Medical History:  Diagnosis Date  . Stroke [facility]) 2004   left side deficits, completely resolved    History reviewed. No pertinent surgical history.  No family history on file.  Social History   Socioeconomic History  . Marital status: Single    Spouse name: Not on file  . Number of children: Not on file  . Years of education: Not on file  . Highest education level: Not on file  Occupational History  . Not on file  Social Needs  . Financial resource strain: Not on file  . Food insecurity:    Worry: Not on file    Inability: Not on file  . Transportation needs:    Medical: Not on file    Non-medical: Not on file  Tobacco Use  . Smoking status: Current Every Day Smoker    Packs/day: 1.00  . Smokeless tobacco: Never Used  Substance and Sexual Activity  . Alcohol use: Yes  .  Drug use: No  . Sexual activity: Not on file  Lifestyle  . Physical activity:    Days per week: Not on file    Minutes per session: Not on file  . Stress: Not on file  Relationships  . Social connections:    Talks on phone: Not on file    Gets together: Not on file    Attends religious service: Not on file    Active member of club or organization: Not on file    Attends meetings of clubs or organizations: Not on file    Relationship status: Not on file  . Intimate partner violence:    Fear of current or ex partner: Not on file    Emotionally abused: Not on file    Physically abused: Not on file    Forced sexual activity: Not on file  Other Topics Concern  . Not on file  Social History Narrative  . Not on file     Physical Exam  Vital Signs and Nursing Notes reviewed Vitals:   08/07/18 1946  BP: 129/82  Pulse: 90  Resp: 17  Temp: 98.2 F (36.8 C)  SpO2: 96%    CONSTITUTIONAL: Well-appearing, NAD NEURO:  Alert and oriented x 3, no focal deficits EYES:  eyes equal and reactive ENT/NECK:  no LAD, no JVD CARDIO:  Regular rate, well-perfused, normal S1 and S2 PULM:  CTAB no wheezing or rhonchi GI/GU:  normal bowel sounds, non-distended, non-tender MSK/SPINE:  No gross deformities, no edema SKIN:  no rash, atraumatic PSYCH:  Appropriate speech and behavior  Diagnostic and Interventional Summary    Labs Reviewed - No data to display  No orders to display    Medications  EPINEPHrine (EPI-PEN) injection 0.3 mg (has no administration in time range)     Procedures Critical Care  ED Course and Medical Decision Making  I have reviewed the triage vital signs and the nursing notes.  Pertinent labs & imaging results that were available during my care of the patient were reviewed by me and considered in my medical decision making (see below for details).  Seemingly resolved allergic reaction in this 43 year old male, will observe for period of time here in the emergency  department, will need referral to allergist, EpiPen given the progression of symptoms.  Monitored for 3 hours in the emergency department with no return of symptoms.  Patient feeling well and requesting discharge.  Discussed options with pharmacy, no pharmacies are open at this time of day here in [place], best course of action would be to pull EpiPen from the Pyxis for patient to take home.  Referred to allergy specialist.  After the discussed management above, the patient was determined to be safe for discharge.  The patient was in agreement with this plan and all questions regarding their care were answered.  ED return precautions were discussed and the patient will return to the ED with any significant worsening of condition.  Elmer Sow. Pilar Plate, MD [facility] Emergency Medicine [facility] [email or web address]  Final Clinical Impressions(s) / ED Diagnoses     ICD-10-CM   1. Allergic reaction, initial encounter T78.40XA     ED Discharge Orders    None         Sabas Sous, MD 08/07/18 2113

## 2018-08-07 NOTE — Discharge Instructions (Signed)
You were evaluated in the Emergency Department and after careful evaluation, we did not find any emergent condition requiring admission or further testing in the hospital.  Your symptoms today seem to be due to an allergic reaction.  Please keep the EpiPen with you at all times and use if you experience return of significant trouble breathing or sensation of throat closure.  Call the allergy specialists to set up an appointment.  If you administer the EpiPen, come to the emergency department for evaluation.  Please return to the Emergency Department if you experience any worsening of your condition.  We encourage you to follow up with a primary care provider.  Thank you for allowing [place] to be a part of your care.
